# Patient Record
Sex: Male | Born: 1970 | Race: White | Hispanic: No | Marital: Single | Smoking: Never smoker
Health system: Southern US, Community
[De-identification: ages and names within clinical notes are randomized; demographics above are authoritative.]

## PROBLEM LIST (undated history)

## (undated) DIAGNOSIS — G43909 Migraine, unspecified, not intractable, without status migrainosus: Secondary | ICD-10-CM

## (undated) DIAGNOSIS — E039 Hypothyroidism, unspecified: Secondary | ICD-10-CM

## (undated) DIAGNOSIS — M5030 Other cervical disc degeneration, unspecified cervical region: Secondary | ICD-10-CM

## (undated) DIAGNOSIS — F411 Generalized anxiety disorder: Secondary | ICD-10-CM

## (undated) HISTORY — DX: Hypothyroidism, unspecified: E03.9

## (undated) HISTORY — DX: Migraine, unspecified, not intractable, without status migrainosus: G43.909

## (undated) HISTORY — DX: Other cervical disc degeneration, unspecified cervical region: M50.30

## (undated) HISTORY — DX: Generalized anxiety disorder: F41.1

---

## 2013-04-23 ENCOUNTER — Emergency Department (HOSPITAL_COMMUNITY)
Admission: EM | Admit: 2013-04-23 | Discharge: 2013-04-23 | Disposition: A | Discharge status: Home / Self Care | Payer: BC Managed Care – PPO | Attending: Emergency Medicine | Admitting: Emergency Medicine

## 2013-04-23 ENCOUNTER — Encounter (HOSPITAL_COMMUNITY): Payer: Self-pay | Admitting: Family Medicine

## 2013-04-23 ENCOUNTER — Emergency Department (HOSPITAL_COMMUNITY): Payer: BC Managed Care – PPO

## 2013-04-23 DIAGNOSIS — Z79899 Other long term (current) drug therapy: Secondary | ICD-10-CM | POA: Insufficient documentation

## 2013-04-23 DIAGNOSIS — R109 Unspecified abdominal pain: Secondary | ICD-10-CM | POA: Insufficient documentation

## 2013-04-23 DIAGNOSIS — R0789 Other chest pain: Secondary | ICD-10-CM

## 2013-04-23 DIAGNOSIS — Z7982 Long term (current) use of aspirin: Secondary | ICD-10-CM | POA: Insufficient documentation

## 2013-04-23 LAB — CBC
HCT: 43.4 % (ref 39.0–52.0)
MCHC: 35 g/dL (ref 30.0–36.0)
MCV: 90 fL (ref 78.0–100.0)
RDW: 12.4 % (ref 11.5–15.5)

## 2013-04-23 LAB — POCT I-STAT TROPONIN I

## 2013-04-23 LAB — BASIC METABOLIC PANEL
BUN: 20 mg/dL (ref 6–23)
Chloride: 99 mEq/L (ref 96–112)
Creatinine, Ser: 0.98 mg/dL (ref 0.50–1.35)
GFR calc Af Amer: >90 mL/min (ref >90)
GFR calc non Af Amer: >90 mL/min (ref >90)
Glucose, Bld: 110 mg/dL — ABNORMAL HIGH (ref 70–99)

## 2013-04-23 MED ORDER — ESOMEPRAZOLE MAGNESIUM 40 MG PO CPDR: 40.0000 mg | DELAYED_RELEASE_CAPSULE | Freq: Every day | ORAL | Status: DC

## 2013-04-23 MED ORDER — SUCRALFATE 1 G PO TABS: 1.0000 g | ORAL_TABLET | Freq: Four times a day (QID) | ORAL | Status: DC

## 2013-04-23 NOTE — ED Notes (Signed)
Pt c/o chronic RLQ abd pain and along with having an episode of sharp left sided CP with radiation to left shoulder/arm. Sts he has been having the CP intermittently over the past week but today the episode was worst than the rest, and also became nauseous and diaphoretic. Pt in nad, skin warm and dry, resp e/u.

## 2013-04-23 NOTE — ED Provider Notes (Signed)
History    CSN: [id number] Arrival date & time 04/23/13  1704  First MD Initiated Contact with Patient 04/23/13 1958     Chief Complaint  Patient presents with  . Chest Pain   (Consider location/radiation/quality/duration/timing/severity/associated sxs/prior Treatment) HPI Patient presents to the emergency department with upper abdominal discomfort, and chest tightness.  Patient, states he said the upper abdominal pain for several months.  Patient, states, that he's had increased discomfort over the last 3 weeks.  Patient, states, that earlier today when he woke up.  He had a constant tightness in his chest until 2:30 PM.patient states, that he does not have shortness of breath, nausea, vomiting, dysuria, weakness, numbness, headache, blurred vision, dizziness, syncope, fever, cough, or diarrhea.  Patient, states he did not take any medications today prior to arrival.  He states he has tried over-the-counter Pepto-Bismol, with some relief of his abdominal discomfort. History reviewed. No pertinent past medical history. History reviewed. No pertinent past surgical history. History reviewed. No pertinent family history. History  Substance Use Topics  . Smoking status: Never Smoker   . Smokeless tobacco: Not on file  . Alcohol Use: No    Review of Systems All other systems negative except as documented in the HPI. All pertinent positives and negatives as reviewed in the HPI.  Allergies  Lactose intolerance (gi)  Home Medications   Current Outpatient Rx  Name  Route  Sig  Dispense  Refill  . aspirin 325 MG tablet   Oral   Take 650 mg by mouth daily as needed for pain.         . citalopram (CELEXA) 20 MG tablet   Oral   Take 20 mg by mouth daily.         . fexofenadine (ALLEGRA) 30 MG tablet   Oral   Take 30 mg by mouth daily.          BP 138/70  Pulse 52  Temp(Src) 98.3 F (36.8 C)  Resp 20  SpO2 100% Physical Exam  Nursing note and vitals  reviewed. Constitutional: He is oriented to person, place, and time. He appears well-developed and well-nourished. No distress.  HENT:  Head: Normocephalic and atraumatic.  Mouth/Throat: Oropharynx is clear and moist.  Eyes: Pupils are equal, round, and reactive to light.  Neck: Normal range of motion. Neck supple.  Cardiovascular: Normal rate, regular rhythm and normal heart sounds.  Exam reveals no gallop and no friction rub.   No murmur heard. Pulmonary/Chest: Effort normal and breath sounds normal. No respiratory distress. He has no wheezes.  Abdominal: Soft. Bowel sounds are normal. He exhibits no distension. There is no tenderness.  Neurological: He is alert and oriented to person, place, and time. He exhibits normal muscle tone. Coordination normal.  Skin: Skin is warm and dry. No rash noted.    ED Course  Procedures (including critical care time) Labs Reviewed  BASIC METABOLIC PANEL - Abnormal; Notable for the following:    CO2 33 (*)    Glucose, Bld 110 (*)    All other components within normal limits  CBC  POCT I-STAT TROPONIN I  POCT I-STAT TROPONIN I   Dg Chest 2 View  04/23/2013   *RADIOLOGY REPORT*  Clinical Data: Chest pain and shortness of breath today.  CHEST - 2 VIEW  Comparison: None.  Findings: Cardiomediastinal silhouette is within normal limits. The lungs are free of focal consolidations and pleural effusions. No edema. Visualized osseous structures have a normal appearance.  IMPRESSION: Negative exam.   Original Report Authenticated By: Norva Pavlov, M.D.   Patient has 2 sets of negative cardiac enzymes.  Presentation seems atypical for cardiac chest pain, based on the fact, constant, symptoms.  He has no symptoms at this time.  Patient be referred to GI and his primary Dr. He is told to return here for any worsening in his condition  MDM   Date: 04/23/2013  Rate: 54  Rhythm: normal sinus rhythm  QRS Axis: normal  Intervals: normal  ST/T Wave  abnormalities: normal  Conduction Disutrbances:none  Narrative Interpretation:   Old EKG Reviewed: none available  MDM Reviewed: nursing note, vitals and previous chart Interpretation: labs, ECG and x-ray      Carlyle Dolly, PA-C 04/23/13 2239

## 2013-04-23 NOTE — ED Notes (Signed)
Lab tech at bedside

## 2013-04-23 NOTE — ED Notes (Signed)
Per pt sts chest pain that has been intermittent over the past week. sts became different today. Tightness in chest, diaphoresis. sts started while he was sitting in his office. sts was seen by his primary and told it was stress and anxiety.

## 2013-04-23 NOTE — ED Notes (Signed)
Pt sts eating makes his abd pain worse.

## 2013-04-24 NOTE — ED Provider Notes (Signed)
Medical screening examination/treatment/procedure(s) were performed by non-physician practitioner and as supervising physician I was immediately available for consultation/collaboration.    Harlem Bula J. Nayanna Seaborn, MD 04/24/13 1612 

## 2013-04-25 ENCOUNTER — Other Ambulatory Visit: Payer: Self-pay | Admitting: Gastroenterology

## 2013-04-25 DIAGNOSIS — R109 Unspecified abdominal pain: Secondary | ICD-10-CM

## 2013-04-25 DIAGNOSIS — R1310 Dysphagia, unspecified: Secondary | ICD-10-CM

## 2013-04-30 ENCOUNTER — Ambulatory Visit
Admission: RE | Admit: 2013-04-30 | Discharge: 2013-04-30 | Disposition: A | Discharge status: Home / Self Care | Payer: BC Managed Care – PPO | Source: Ambulatory Visit | Attending: Gastroenterology | Admitting: Gastroenterology

## 2013-04-30 ENCOUNTER — Other Ambulatory Visit: Payer: BC Managed Care – PPO

## 2013-04-30 DIAGNOSIS — R1310 Dysphagia, unspecified: Secondary | ICD-10-CM

## 2013-05-04 ENCOUNTER — Ambulatory Visit
Admission: RE | Admit: 2013-05-04 | Discharge: 2013-05-04 | Disposition: A | Discharge status: Home / Self Care | Payer: BC Managed Care – PPO | Source: Ambulatory Visit | Attending: Gastroenterology | Admitting: Gastroenterology

## 2013-05-04 DIAGNOSIS — R109 Unspecified abdominal pain: Secondary | ICD-10-CM

## 2013-10-19 IMAGING — RF DG ESOPHAGUS
9 of 10 series · 20 of 24 positions shown · non-contrast
Comparison: None.

CLINICAL DATA: Dysphagia.

ESOPHAGUS/BARIUM SWALLOW/TABLET STUDY
Fluoroscopy Time: 1 minute 0 seconds

[Series 1: run · 6 of 10 slices shown (1 of 9)]
[im 1/10]
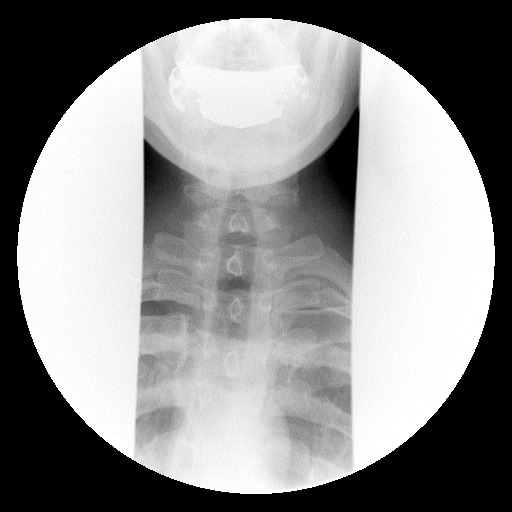
[im 2/10]
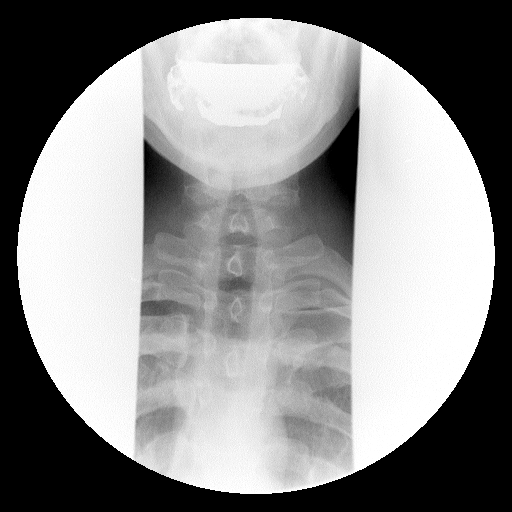
[im 5/10]
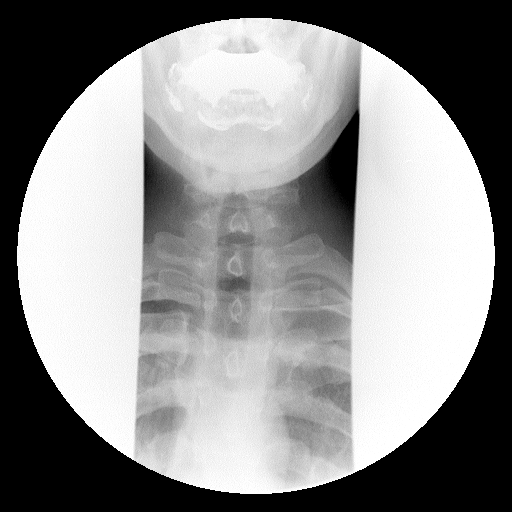
[im 7/10]
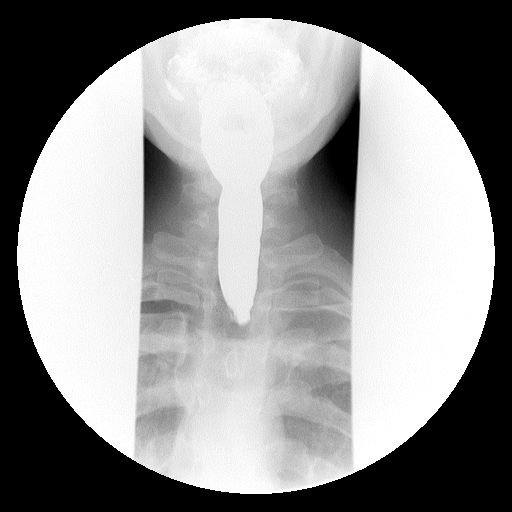
[im 8/10]
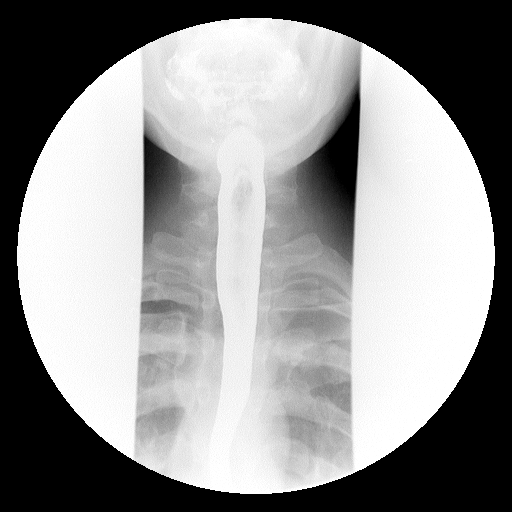
[im 10/10]
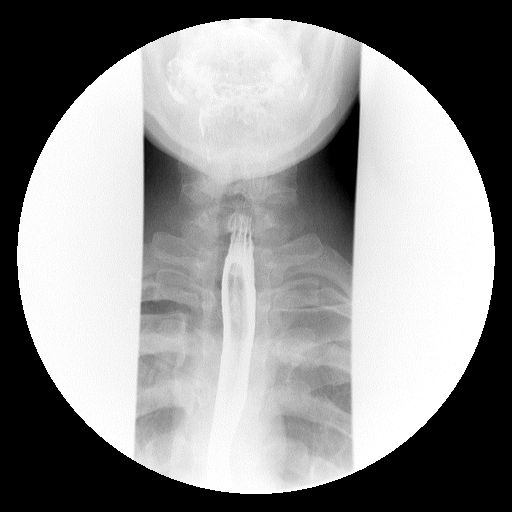

[Series 2: run · 1 of 2 slices shown (2 of 9)]
[im 1/2]
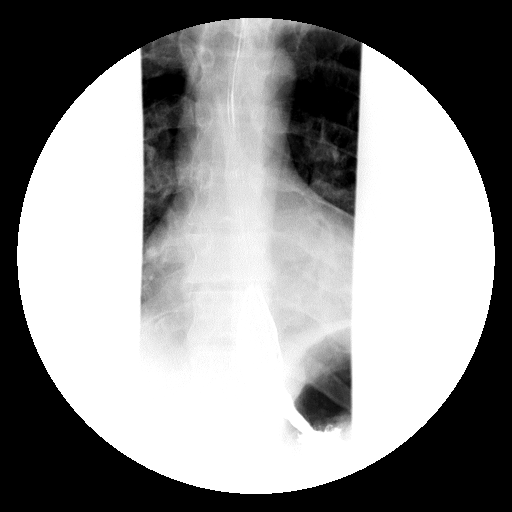

[Series 3: run · 4 of 8 slices shown (3 of 9)]
[im 2/8]
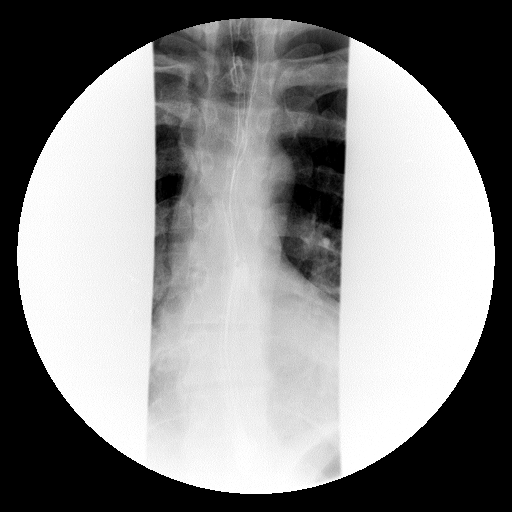
[im 4/8]
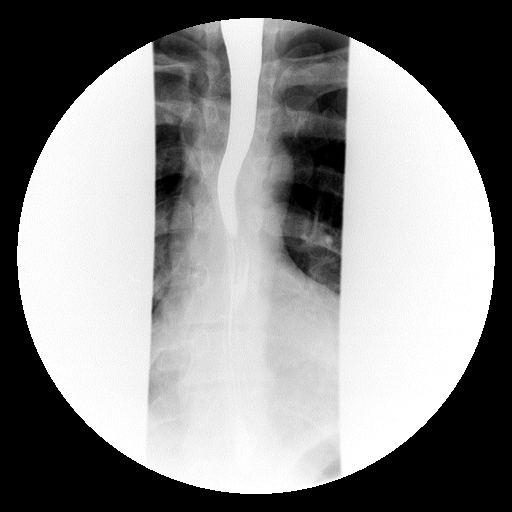
[im 6/8]
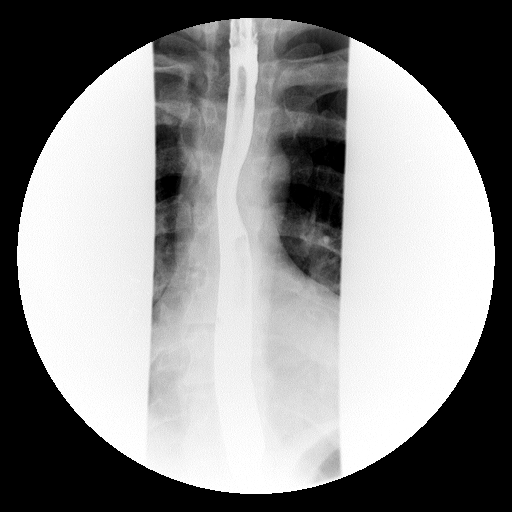
[im 8/8]
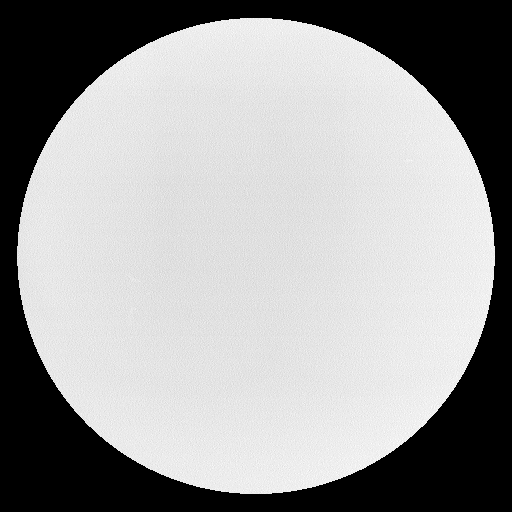

[Series 4: run · 2 of 5 slices shown (4 of 9)]
[im 1/5]
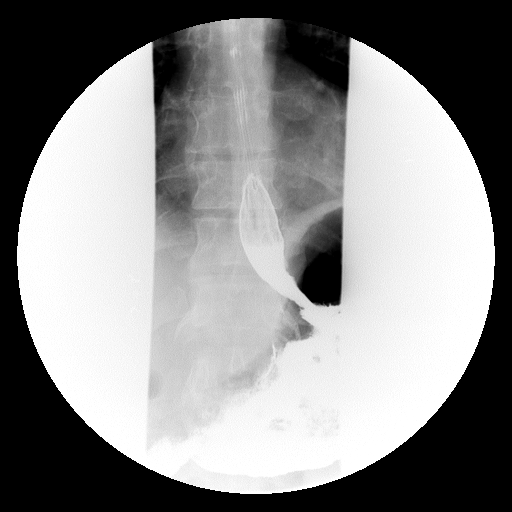
[im 5/5]
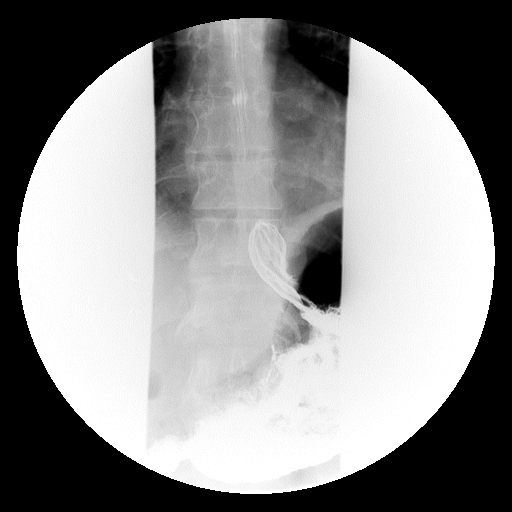

[Series 5: run · 3 of 5 slices shown (5 of 9)]
[im 1/5]
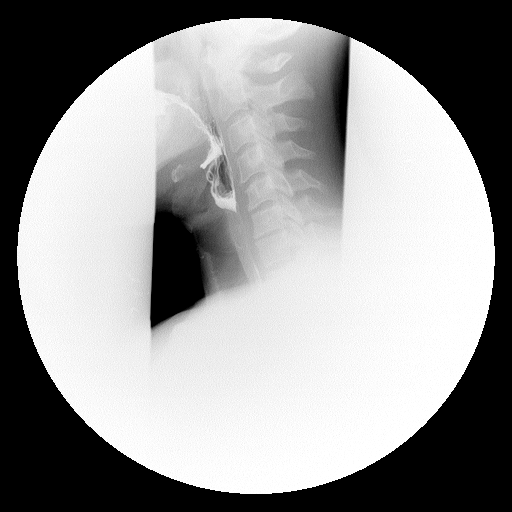
[im 3/5]
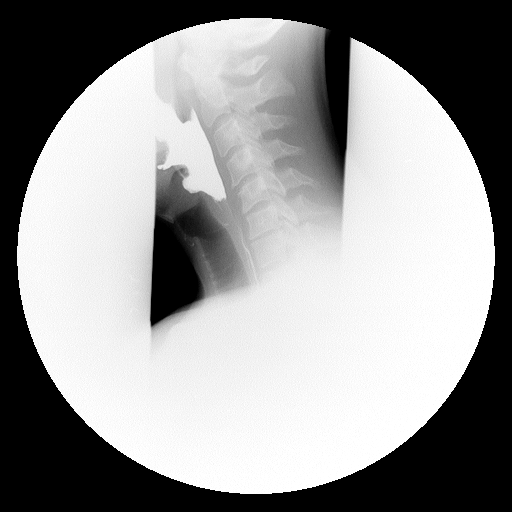
[im 5/5]
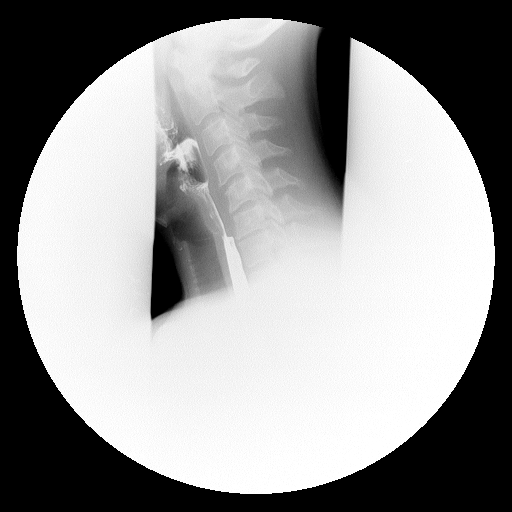

[Series 6: run · 1 of 2 slices shown (6 of 9)]
[im 1/2]
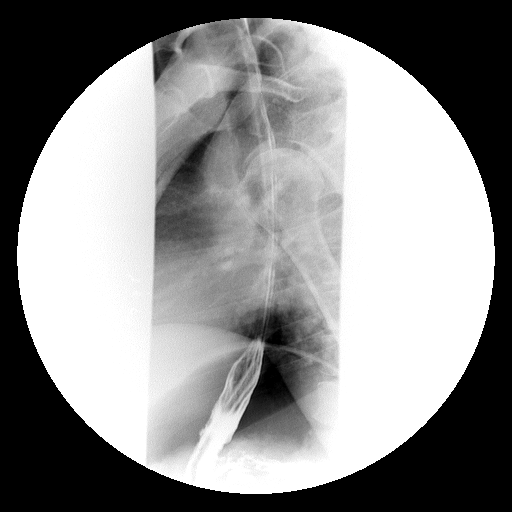

[Series 8: run · 1 of 1 slices shown (7 of 9)]
[im 1/1]
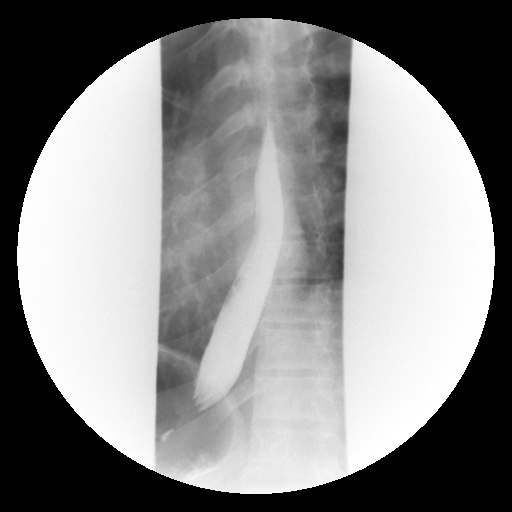

[Series 9: run · 1 of 1 slices shown (8 of 9)]
[im 1/1]
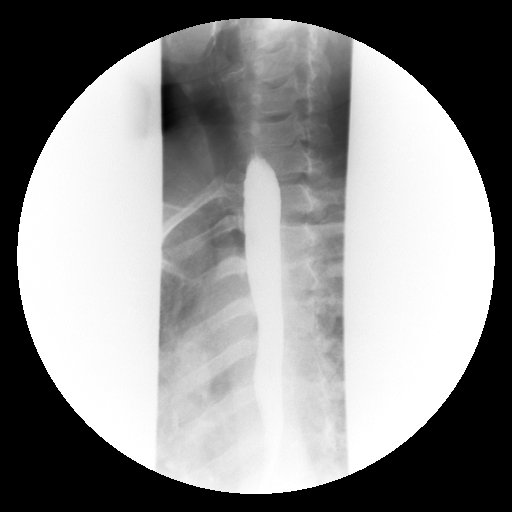

[Series 10: run · 1 of 1 slices shown (9 of 9)]
[im 1/1]
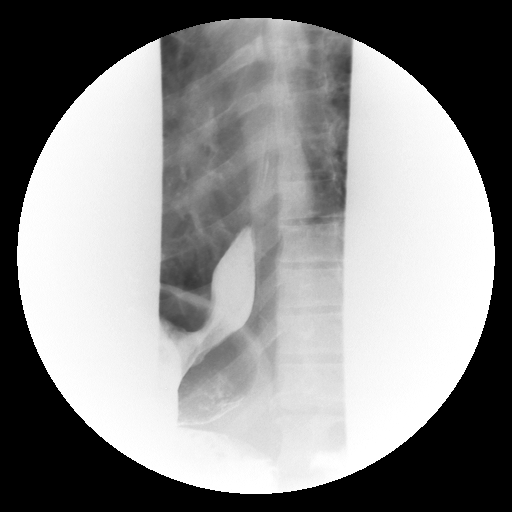

[20 of 24 positions shown; findings below may reference images not displayed]

FINDINGS: The mucosa and motility of the esophagus are normal.  No
hiatal hernia or gastroesophageal reflux.  No esophagitis or
stricture.  The oropharyngeal swallowing mechanisms are normal.

A 13 mm barium tablet passed immediately from the mouth to the
stomach with no delay at all.
IMPRESSION: Normal barium esophagram.

## 2017-12-26 ENCOUNTER — Encounter: Payer: Self-pay | Admitting: Primary Care

## 2017-12-26 ENCOUNTER — Ambulatory Visit: Payer: 59 | Admitting: Primary Care

## 2017-12-26 VITALS — BP 122/82 | HR 60 | Temp 98.2°F | Ht 68.25 in | Wt 209.8 lb

## 2017-12-26 DIAGNOSIS — R140 Abdominal distension (gaseous): Secondary | ICD-10-CM | POA: Insufficient documentation

## 2017-12-26 DIAGNOSIS — K219 Gastro-esophageal reflux disease without esophagitis: Secondary | ICD-10-CM | POA: Insufficient documentation

## 2017-12-26 DIAGNOSIS — E038 Other specified hypothyroidism: Secondary | ICD-10-CM | POA: Diagnosis not present

## 2017-12-26 DIAGNOSIS — E349 Endocrine disorder, unspecified: Secondary | ICD-10-CM | POA: Diagnosis not present

## 2017-12-26 DIAGNOSIS — E063 Autoimmune thyroiditis: Secondary | ICD-10-CM

## 2017-12-26 NOTE — Assessment & Plan Note (Signed)
[facility], managed on "1 cc" of testosterone weekly.

## 2017-12-26 NOTE — Assessment & Plan Note (Signed)
Chronic with periods of constipation and diarrhea. Sounds similar to IBS. Colonoscopy and endoscopy years ago unremarkable. He is following with [facility] and will be undergoing further workup tomorrow.

## 2017-12-26 NOTE — Assessment & Plan Note (Signed)
Diagnosed 4 years ago, managed on Armour Thyorid, not sure of dose. Discussed to call us with the dose of his current Armour Thyorid. He is following with [facility].

## 2017-12-26 NOTE — Assessment & Plan Note (Signed)
Discussed to start omeprazole/nexium daily to prevent recurrent GERD symptoms. Discussed diet triggers.

## 2017-12-26 NOTE — Patient Instructions (Signed)
Please call with the dose of your Armour Thyroid.  Please also bring me copies of your upcoming labs [facility].  It was a pleasure to meet you today! Please don't hesitate to call or message me with any questions. Welcome to [facility]!

## 2017-12-26 NOTE — Progress Notes (Signed)
Subjective:    Patient ID: Hector Hart, male    DOB: 1971-08-30, 47 y.o.   MRN: [id number]  HPI  Mr. Hector Hart is a 47 year old male who presents today to establish care and discuss the problems mentioned below. Will obtain old records.  1) GERD: Currently managed on Nexium or Omeprazole for which he takes several times weekly on average. He experiences symptoms of esophageal burning and reflux every several days.   2) Testosterone Deficiency: Currently managed on testosterone replacement and is following with [facility]. He is injecting 1cc weekly.   3) Hypothyroidism: History of Hashimoto's Thyroiditis, diagnosed 4 years ago. Last TSH of 1.5 in January 2018 through [facility]. He's currently managed on Armour Thyroid for which he's not sure of the dose. He is currently following through Hector Hart for whom he follows with once monthly.   4) "Adrenal Problems": Began around 2014. Experienced a period of time with rapid weight gain of 20 pounds in several months, intermittent anxiety, He mostly struggles now with abdominal bloating with constipation and diarrhea. He was once seen by GI several years ago underwent colonoscopy and endoscopy which were unremarkable. He thinks he was once tested for Addison's Disease but was never diagnosed. He thinks he was once on Levsin for IBS without improvement.   He was diagnosed with Hashimoto's Thyroiditis 4 years ago. His symptoms returned around Thanksgiving 2018 with abdominal bloating, difficulty sleeping, anxiety, weight gain. He is following with [facility] and will be undergoing lab work up tomorrow.   Review of Systems  Eyes: Negative for visual disturbance.  Respiratory: Negative for shortness of breath.   Cardiovascular: Negative for chest pain.  Gastrointestinal:       Intermittent abdominal bloating with constipation and diarrhea.  Musculoskeletal:       Chronic neck pain  Neurological: Negative for dizziness and headaches.   Psychiatric/Behavioral: The patient is nervous/anxious.        Past Medical History:  Diagnosis Date  . DDD (degenerative disc disease), cervical   . GAD (generalized anxiety disorder)   . Hypothyroidism   . Migraines      Social History   Socioeconomic History  . Marital status: Single    Spouse name: Not on file  . Number of children: Not on file  . Years of education: Not on file  . Highest education level: Not on file  Social Needs  . Financial resource strain: Not on file  . Food insecurity - worry: Not on file  . Food insecurity - inability: Not on file  . Transportation needs - medical: Not on file  . Transportation needs - non-medical: Not on file  Occupational History  . Not on file  Tobacco Use  . Smoking status: Never Smoker  . Smokeless tobacco: Never Used  Substance and Sexual Activity  . Alcohol use: No  . Drug use: Not on file  . Sexual activity: Not on file  Other Topics Concern  . Not on file  Social History Narrative   Single.   No children.    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    Enjoys exercising, working on his house    No past surgical history on file.  Family History  Problem Relation Age of Onset  . Breast cancer Mother   . Alcohol abuse Mother   . Heart attack Mother   . Alcohol abuse Father   . Heart attack Father   . Hyperlipidemia  Father   . Hypertension Father     Allergies  Allergen Reactions  . Lactose Intolerance (Gi)     Current Outpatient Medications on File Prior to Visit  Medication Sig Dispense Refill  . esomeprazole (NEXIUM) 40 MG capsule Take 1 capsule (40 mg total) by mouth daily. 14 capsule 0  . fexofenadine (ALLEGRA) 30 MG tablet Take 30 mg by mouth daily.    . sucralfate (CARAFATE) 1 G tablet Take 1 tablet (1 g total) by mouth 4 (four) times daily. 28 tablet 0  . aspirin 325 MG tablet Take 650 mg by mouth daily as needed for pain.     No current facility-administered medications on file prior to  visit.     BP 122/82   Pulse 60   Temp 98.2 F (36.8 C) (Oral)   Ht 5' 8.25" (1.734 m)   Wt 209 lb 12 oz (95.1 kg)   SpO2 96%   BMI 31.66 kg/m    Objective:   Physical Exam  Constitutional: He is oriented to person, place, and time. He appears well-nourished.  Neck: Neck supple.  Cardiovascular: Normal rate and regular rhythm.  Pulmonary/Chest: Effort normal and breath sounds normal. He has no wheezes. He has no rales.  Neurological: He is alert and oriented to person, place, and time.  Skin: Skin is warm and dry.  Psychiatric: He has a normal mood and affect.          Assessment & Plan:

## 2018-05-02 ENCOUNTER — Other Ambulatory Visit: Payer: Self-pay | Admitting: Primary Care

## 2018-05-02 DIAGNOSIS — Z7251 High risk heterosexual behavior: Secondary | ICD-10-CM

## 2018-05-02 NOTE — Telephone Encounter (Signed)
Electronically refill request for TRUVADA 200-300 MG tablet   Medication is not on current medication list   Last office visit on 12/26/2017

## 2018-05-03 NOTE — Telephone Encounter (Signed)
Please call patient and notify him that we do not have record of him taking Truvada. Who prescribes this for him?  How long as he been taking?  Why does he take it?  Why was this not disclosed during his visit in March?

## 2018-05-08 ENCOUNTER — Encounter: Payer: Self-pay | Admitting: Primary Care

## 2018-05-08 MED ORDER — EMTRICITABINE-TENOFOVIR DF 200-300 MG PO TABS: 1.0000 | ORAL_TABLET | Freq: Every day | ORAL | 0 refills | Status: DC

## 2018-05-08 NOTE — Telephone Encounter (Signed)
Noted and am happy to continue the medication but we'll need some updated labs for monitoring purposes.  I'll send a refill to his pharmacy now, but please get him scheduled for a lab only appointment for routine monitoring.

## 2018-05-08 NOTE — Telephone Encounter (Signed)
This medication was prescribed by his old md. Pt can not remember her name. Pt has restarted taking this med feb 2019. Pt takes this med for prevention. Pt did not  mention medication during march visit because  he could not remember the name.

## 2018-05-10 MED ORDER — EMTRICITABINE-TENOFOVIR DF 200-300 MG PO TABS: 1.0000 | ORAL_TABLET | Freq: Every day | ORAL | 0 refills | Status: AC

## 2018-05-10 NOTE — Telephone Encounter (Signed)
Per DPR, left detail message of Kate Clark's comments for patient to call back 

## 2018-05-24 ENCOUNTER — Encounter (INDEPENDENT_AMBULATORY_CARE_PROVIDER_SITE_OTHER): Payer: Self-pay

## 2018-05-24 NOTE — Telephone Encounter (Signed)
Sending patient a letter regarding Graylon GunningKate Clark's comments.

## 2018-06-14 ENCOUNTER — Encounter: Payer: Self-pay | Admitting: Internal Medicine

## 2018-06-14 ENCOUNTER — Ambulatory Visit: Payer: 59 | Admitting: Internal Medicine

## 2018-06-14 VITALS — BP 122/82 | HR 55 | Temp 98.2°F | Wt 200.0 lb

## 2018-06-14 DIAGNOSIS — R21 Rash and other nonspecific skin eruption: Secondary | ICD-10-CM | POA: Diagnosis not present

## 2018-06-14 MED ORDER — VALACYCLOVIR HCL 1 G PO TABS: 1000.0000 mg | ORAL_TABLET | Freq: Three times a day (TID) | ORAL | 0 refills | Status: AC

## 2018-06-14 NOTE — Progress Notes (Signed)
Subjective:    Patient ID: Hector Hart, male    DOB: [birth year], 47 y.o.   MRN: [id number]  HPI  Pt presents to the clinic today with c/o a rash. He noticed this 1 week ago but seems to have worsened in the last 2-3 days. The rash is located on his chest and has spread to his abdomen. He reports the rash itches and burns. He denies changes in soaps, lotions or detergents. He has not been around anyone with a similar rash. He has not tried anything OTC for this.  Review of Systems  Past Medical History:  Diagnosis Date  . DDD (degenerative disc disease), cervical   . GAD (generalized anxiety disorder)   . Hypothyroidism   . Migraines     Current Outpatient Medications  Medication Sig Dispense Refill  . aspirin 325 MG tablet Take 325 mg by mouth daily as needed for pain.     Marland Kitchen emtricitabine-tenofovir (TRUVADA) 200-300 MG tablet Take 1 tablet by mouth daily. 90 tablet 0  . fexofenadine (ALLEGRA) 30 MG tablet Take 30 mg by mouth daily.    Marland Kitchen testosterone cypionate (DEPOTESTOSTERONE CYPIONATE) 200 MG/ML injection Inject 1 mL into the muscle once a week.    Mack Guise THYROID 120 MG tablet TK 1 T PO ONCE D OES  0   No current facility-administered medications for this visit.     Allergies  Allergen Reactions  . Lactose Intolerance (Gi)     Family History  Problem Relation Age of Onset  . Breast cancer Mother   . Alcohol abuse Mother   . Heart attack Mother   . Alcohol abuse Father   . Heart attack Father   . Hyperlipidemia Father   . Hypertension Father     Social History   Socioeconomic History  . Marital status: Single    Spouse name: Not on file  . Number of children: Not on file  . Years of education: Not on file  . Highest education level: Not on file  Occupational History  . Not on file  Social Needs  . Financial resource strain: Not on file  . Food insecurity:    Worry: Not on file    Inability: Not on file  . Transportation needs:    Medical: Not  on file    Non-medical: Not on file  Tobacco Use  . Smoking status: Never Smoker  . Smokeless tobacco: Never Used  Substance and Sexual Activity  . Alcohol use: No  . Drug use: Not on file  . Sexual activity: Not on file  Lifestyle  . Physical activity:    Days per week: Not on file    Minutes per session: Not on file  . Stress: Not on file  Relationships  . Social connections:    Talks on phone: Not on file    Gets together: Not on file    Attends religious service: Not on file    Active member of club or organization: Not on file    Attends meetings of clubs or organizations: Not on file    Relationship status: Not on file  . Intimate partner violence:    Fear of current or ex partner: Not on file    Emotionally abused: Not on file    Physically abused: Not on file    Forced sexual activity: Not on file  Other Topics Concern  . Not on file  Social History Narrative   Single.   No children.  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  Skin: Pt reports rash of chest and abdomen. Denies ulcercations.    No other specific complaints in a complete review of systems (except as listed in HPI above).     Objective:   Physical Exam  BP 122/82   Pulse (!) 55   Temp 98.2 F (36.8 C) (Oral)   Wt 200 lb (90.7 kg)   SpO2 97%   BMI 30.19 kg/m  Wt Readings from Last 3 Encounters:  06/14/18 200 lb (90.7 kg)  12/26/17 209 lb 12 oz (95.1 kg)    General: Appears his stated age, well developed, well nourished in NAD. Skin: Grouped follicular rash on erythematous base noted over the sternum, epigastric and RUQ.  BMET    Component Value Date/Time   NA 139 04/23/2013 1722   K 4.8 04/23/2013 1722   CL 99 04/23/2013 1722   CO2 33 (H) 04/23/2013 1722   GLUCOSE 110 (H) 04/23/2013 1722   BUN 20 04/23/2013 1722   CREATININE 0.98 04/23/2013 1722    CALCIUM 9.1 04/23/2013 1722   GFRNONAA >90 04/23/2013 1722   GFRAA >90 04/23/2013 1722    Lipid Panel  No results found for: CHOL, TRIG, HDL, CHOLHDL, VLDL, LDLCALC  CBC    Component Value Date/Time   WBC 7.4 04/23/2013 1722   RBC 4.82 04/23/2013 1722   HGB 15.2 04/23/2013 1722   HCT 43.4 04/23/2013 1722   PLT 303 04/23/2013 1722   MCV 90.0 04/23/2013 1722   MCH 31.5 04/23/2013 1722   MCHC 35.0 04/23/2013 1722   RDW 12.4 04/23/2013 1722    Hgb A1C No results found for: HGBA1C          Assessment & Plan:   Rash:  Concerning for atypical shingles Will treat with Valtrex x 7 days Advised him to stay away from infants  <1 year, pregnant women, cancer or transplant patients. Could be a folliculitis- offered to cover him with topical Bactroban, he declines  Return precautions discussed Nicki Reaper, NP

## 2018-06-14 NOTE — Patient Instructions (Signed)

## 2018-06-28 ENCOUNTER — Encounter: Payer: Self-pay | Admitting: Family Medicine

## 2018-06-28 ENCOUNTER — Ambulatory Visit: Payer: 59 | Admitting: Family Medicine

## 2018-06-28 VITALS — BP 114/70 | HR 60 | Temp 98.4°F | Wt 199.2 lb

## 2018-06-28 DIAGNOSIS — R21 Rash and other nonspecific skin eruption: Secondary | ICD-10-CM | POA: Diagnosis not present

## 2018-06-28 DIAGNOSIS — J029 Acute pharyngitis, unspecified: Secondary | ICD-10-CM

## 2018-06-28 DIAGNOSIS — J020 Streptococcal pharyngitis: Secondary | ICD-10-CM | POA: Diagnosis not present

## 2018-06-28 LAB — POCT RAPID STREP A (OFFICE): RAPID STREP A SCREEN: POSITIVE — AB

## 2018-06-28 MED ORDER — AMOXICILLIN 500 MG PO CAPS: 500.0000 mg | ORAL_CAPSULE | Freq: Two times a day (BID) | ORAL | 0 refills | Status: AC

## 2018-06-28 NOTE — Patient Instructions (Addendum)
Good to see you today  Rash is likely folliculitis- use an antibacterial soap like dial daily for 10-14 days, keep area clean and dry as much as possible.  Can apply bacitracin 1-2 times daily if desired- would suggest this if no improvement with antibacterial soap  After 3 doses of antibiotic, wash sheets, towels, change tooth brush   Strep Throat Strep throat is a bacterial infection of the throat. Your health care provider may call the infection tonsillitis or pharyngitis, depending on whether there is swelling in the tonsils or at the back of the throat. Strep throat is most common during the cold months of the year in children who are 56-20 years of age, but it can happen during any season in people of any age. This infection is spread from person to person (contagious) through coughing, sneezing, or close contact. What are the causes? Strep throat is caused by the bacteria called Streptococcus pyogenes. What increases the risk? This condition is more likely to develop in:  People who spend time in crowded places where the infection can spread easily.  People who have close contact with someone who has strep throat.  What are the signs or symptoms? Symptoms of this condition include:  Fever or chills.  Redness, swelling, or pain in the tonsils or throat.  Pain or difficulty when swallowing.  White or yellow spots on the tonsils or throat.  Swollen, tender glands in the neck or under the jaw.  Red rash all over the body (rare).  How is this diagnosed? This condition is diagnosed by performing a rapid strep test or by taking a swab of your throat (throat culture test). Results from a rapid strep test are usually ready in a few minutes, but throat culture test results are available after one or two days. How is this treated? This condition is treated with antibiotic medicine. Follow these instructions at home: Medicines  Take over-the-counter and prescription medicines only  as told by your health care provider.  Take your antibiotic as told by your health care provider. Do not stop taking the antibiotic even if you start to feel better.  Have family members who also have a sore throat or fever tested for strep throat. They may need antibiotics if they have the strep infection. Eating and drinking  Do not share food, drinking cups, or personal items that could cause the infection to spread to other people.  If swallowing is difficult, try eating soft foods until your sore throat feels better.  Drink enough fluid to keep your urine clear or pale yellow. General instructions  Gargle with a salt-water mixture 3-4 times per day or as needed. To make a salt-water mixture, completely dissolve -1 tsp of salt in 1 cup of warm water.  Make sure that all household members wash their hands well.  Get plenty of rest.  Stay home from school or work until you have been taking antibiotics for 24 hours.  Keep all follow-up visits as told by your health care provider. This is important. Contact a health care provider if:  The glands in your neck continue to get bigger.  You develop a rash, cough, or earache.  You cough up a thick liquid that is green, yellow-brown, or bloody.  You have pain or discomfort that does not get better with medicine.  Your problems seem to be getting worse rather than better.  You have a fever. Get help right away if:  You have new symptoms, such as vomiting,  severe headache, stiff or painful neck, chest pain, or shortness of breath.  You have severe throat pain, drooling, or changes in your voice.  You have swelling of the neck, or the skin on the neck becomes red and tender.  You have signs of dehydration, such as fatigue, dry mouth, and decreased urination.  You become increasingly sleepy, or you cannot wake up completely.  Your joints become red or painful. This information is not intended to replace advice given to you by  your health care provider. Make sure you discuss any questions you have with your health care provider. Document Released: 09/24/2000 Document Revised: 05/26/2016 Document Reviewed: 01/20/2015 Elsevier Interactive Patient Education  [employer]2018 [employer]

## 2018-06-28 NOTE — Progress Notes (Signed)
Subjective:    Patient ID: Hector Hart, male    DOB: Aug 02, 1971, 47 y.o.   MRN: [id number]  HPI This is a 47 yo male who presents today for follow up of rash. Was seen two weeks ago with rash on his chest. Was concerning for shingles so was treated with valacyclovir. Today he reports that rash has not spread, no drainage, no itching, but has not resolved. Using dove soap. Not applying anything in addition.   Sore throat x 2 days, fatigued, no cough, achy, no fever, some nasal congestion. No known sick contacts.   Past Medical History:  Diagnosis Date  . DDD (degenerative disc disease), cervical   . GAD (generalized anxiety disorder)   . Hypothyroidism   . Migraines    No past surgical history on file. Family History  Problem Relation Age of Onset  . Breast cancer Mother   . Alcohol abuse Mother   . Heart attack Mother   . Alcohol abuse Father   . Heart attack Father   . Hyperlipidemia Father   . Hypertension Father    Social History   Tobacco Use  . Smoking status: Never Smoker  . Smokeless tobacco: Never Used  Substance Use Topics  . Alcohol use: No  . Drug use: Not on file      Review of Systems     Objective:   Physical Exam  Constitutional: He is oriented to person, place, and time. He appears well-developed and well-nourished. No distress.  HENT:  Head: Normocephalic and atraumatic.  Right Ear: Tympanic membrane, external ear and ear canal normal.  Left Ear: Tympanic membrane, external ear and ear canal normal.  Nose: Nose normal.  Mouth/Throat: Uvula is midline and mucous membranes are normal. Posterior oropharyngeal erythema present. No oropharyngeal exudate, posterior oropharyngeal edema or tonsillar abscesses. Tonsils are 2+ on the right. Tonsils are 2+ on the left. No tonsillar exudate.  Eyes: Conjunctivae are normal.  Neck: Normal range of motion. Neck supple.  Cardiovascular: Normal rate, regular rhythm and normal heart sounds.    Pulmonary/Chest: Effort normal and breath sounds normal.  Neurological: He is alert and oriented to person, place, and time.  Skin: Skin is warm and dry. Rash noted. He is not diaphoretic.  Chest with erythematous papular rash. No pustules or vesicles.   Psychiatric: He has a normal mood and affect. His behavior is normal. Judgment and thought content normal.  Vitals reviewed.        BP 114/70   Pulse 60   Temp 98.4 F (36.9 C) (Oral)   Wt 199 lb 4 oz (90.4 kg)   SpO2 98%   BMI 30.07 kg/m  Wt Readings from Last 3 Encounters:  06/28/18 199 lb 4 oz (90.4 kg)  06/14/18 200 lb (90.7 kg)  12/26/17 209 lb 12 oz (95.1 kg)   Results for orders placed or performed in visit on 06/28/18  Rapid Strep A  Result Value Ref Range   Rapid Strep A Screen Positive (A) Negative    Assessment & Plan:  1. Sore throat - Rapid Strep A  2. Strep pharyngitis - Provided written and verbal information regarding diagnosis and treatment. - amoxicillin (AMOXIL) 500 MG capsule; Take 1 capsule (500 mg total) by mouth 2 (two) times daily.  Dispense: 20 capsule; Refill: 0  3. Rash - Discussed using antibacterial soap to treat if folliculitis, can apply bacitracin if no improvemetn - RTC if worsening, no resolution in 5-7 days   Olean Reeeborah Connery Shiffler,  FNP-BC  [facility], [facility]  06/28/2018 1:59 PM
